# Patient Record
Sex: Male | Born: 1990 | Race: White | Hispanic: No | Marital: Single | Smoking: Current every day smoker
Health system: Southern US, Community
[De-identification: ages and names within clinical notes are randomized; demographics above are authoritative.]

## PROBLEM LIST (undated history)

## (undated) DIAGNOSIS — J45909 Unspecified asthma, uncomplicated: Secondary | ICD-10-CM

## (undated) DIAGNOSIS — I10 Essential (primary) hypertension: Secondary | ICD-10-CM

## (undated) HISTORY — PX: WISDOM TOOTH EXTRACTION: SHX21

---
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
## 2011-09-01 ENCOUNTER — Emergency Department: Payer: Self-pay | Admitting: Emergency Medicine

## 2012-10-24 IMAGING — CR DG SHOULDER 3+V*R*
1 series · 4 of 4 positions shown · non-contrast
Comparison: none

REASON FOR EXAM: pain
COMMENTS:   May transport without cardiac monitor

PROCEDURE:     DXR - DXR SHOULDER RIGHT COMPLETE  - September 01, 2011 [DATE]
RESULT:     No fracture, dislocation or other acute bony abnormality is
identified.

[Series 1: w shoulder external right · 0.14mm/px · 4 of 4 slices shown]
[im 1/4]
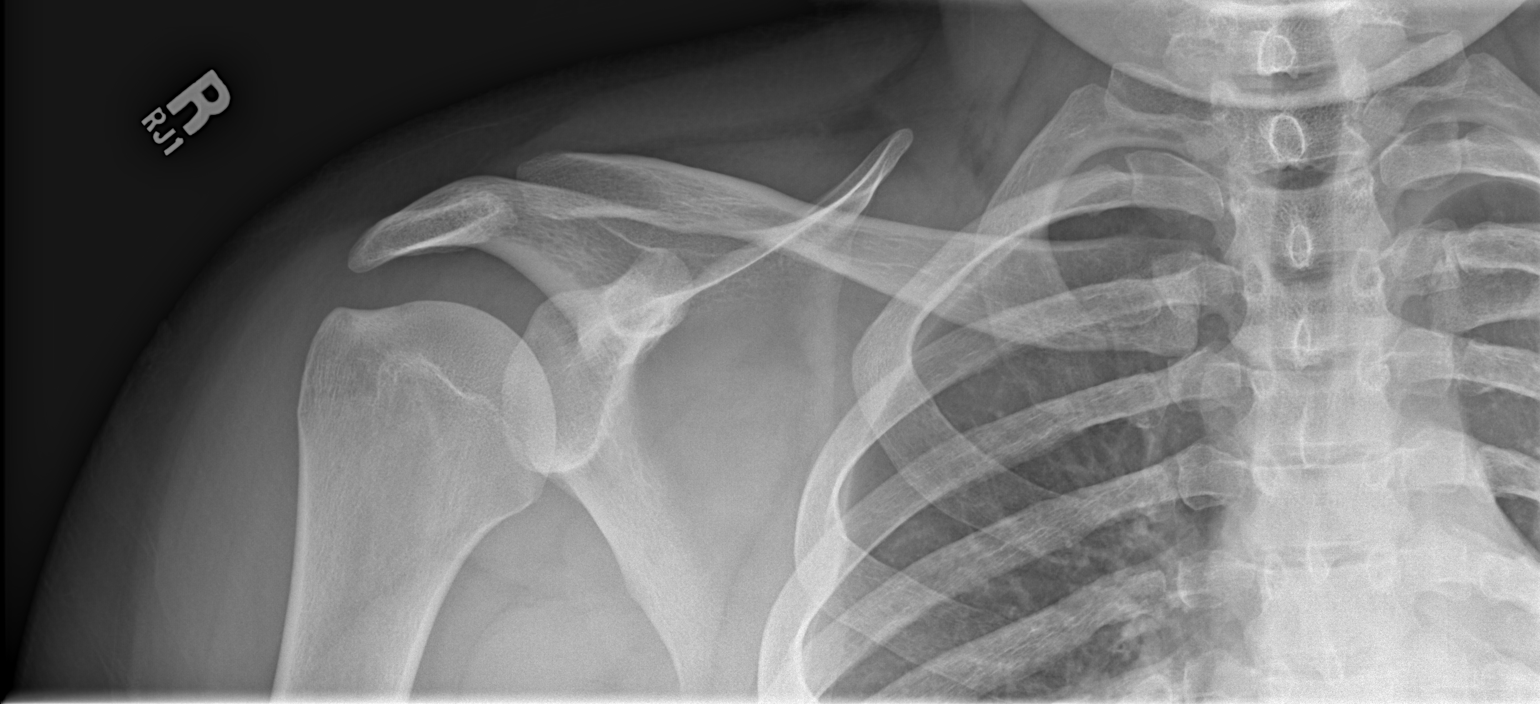
[im 2/4]
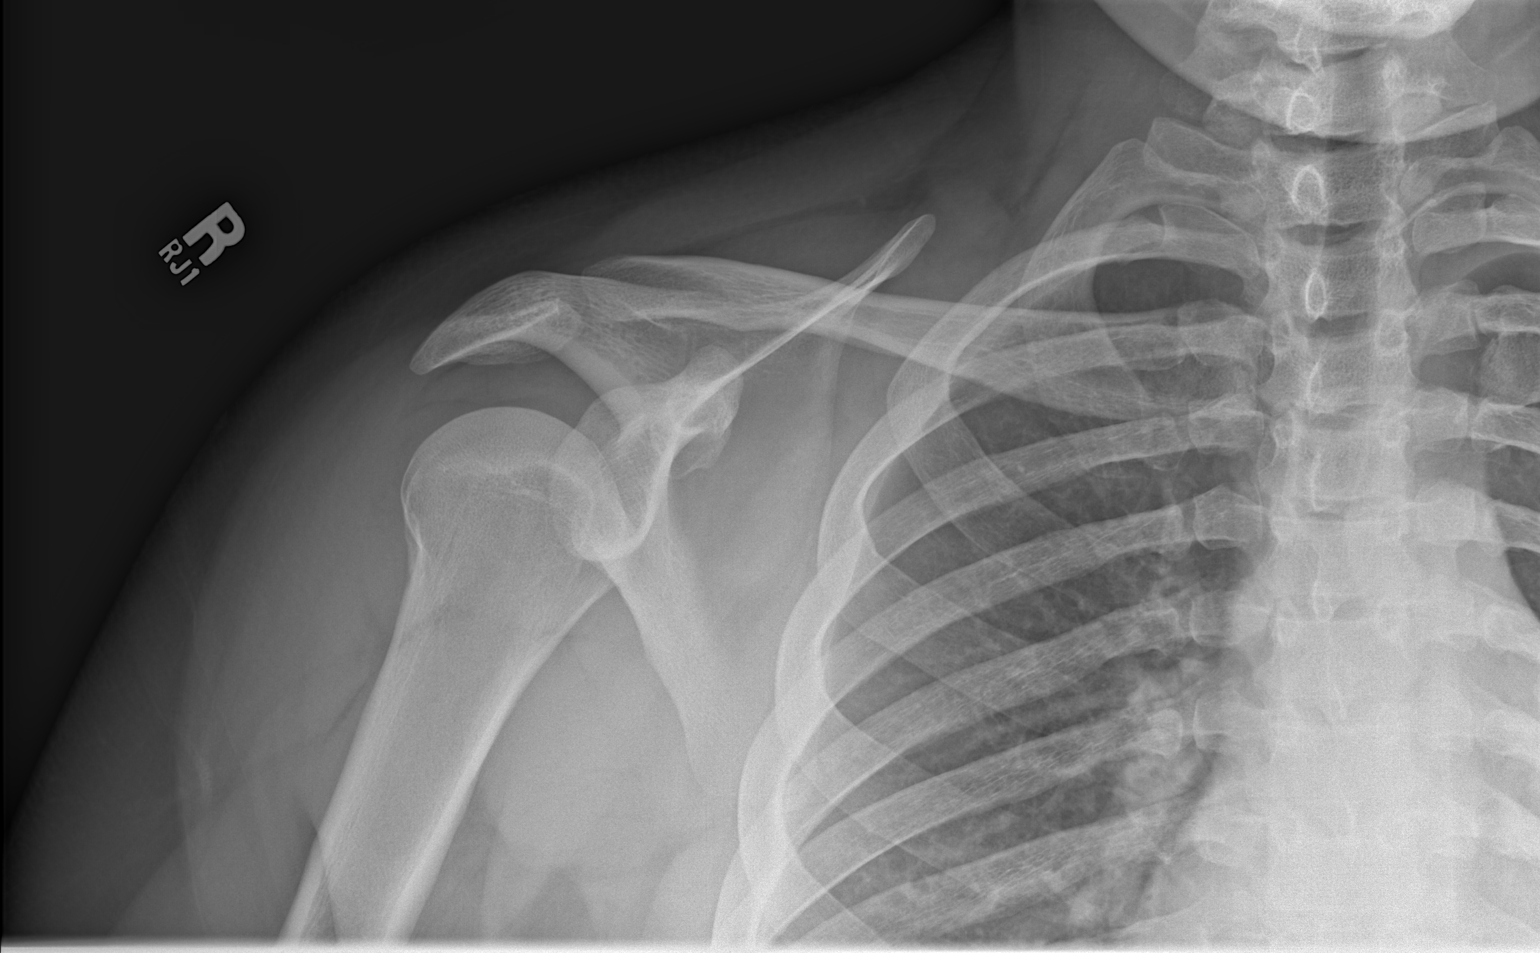
[im 3/4]
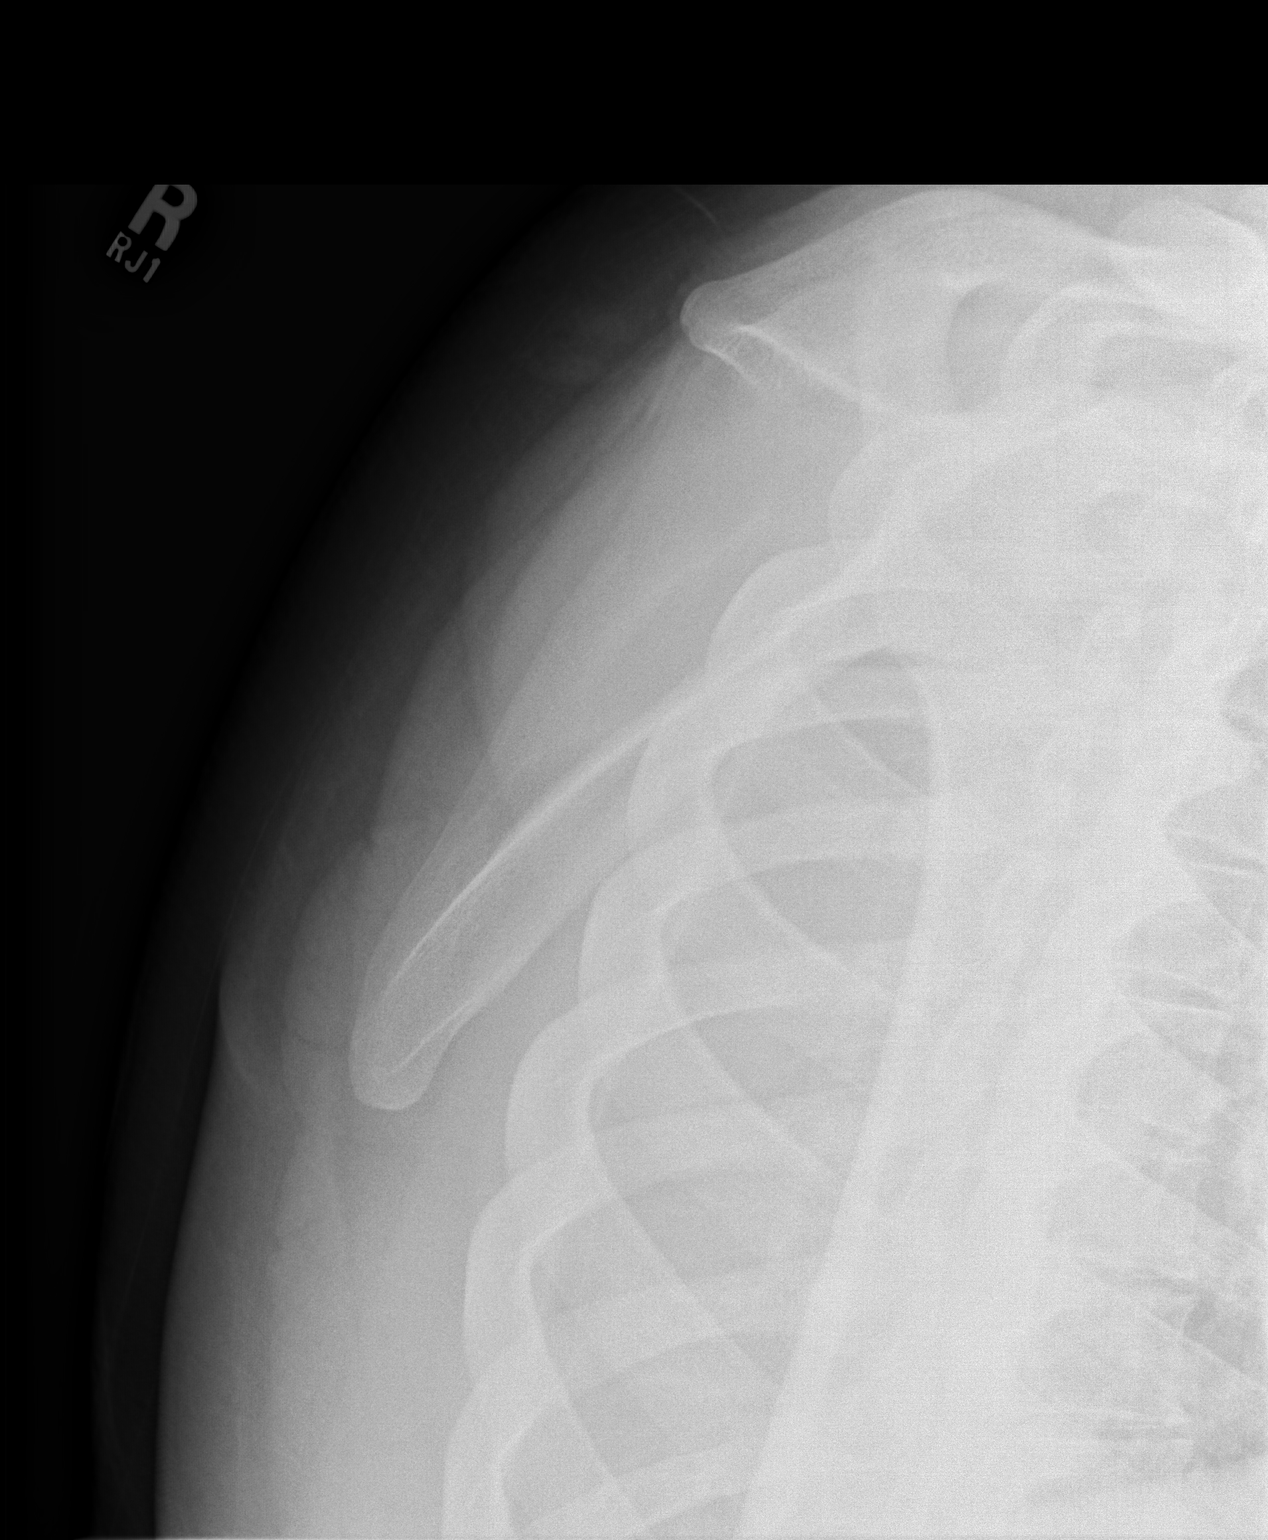
[im 4/4]
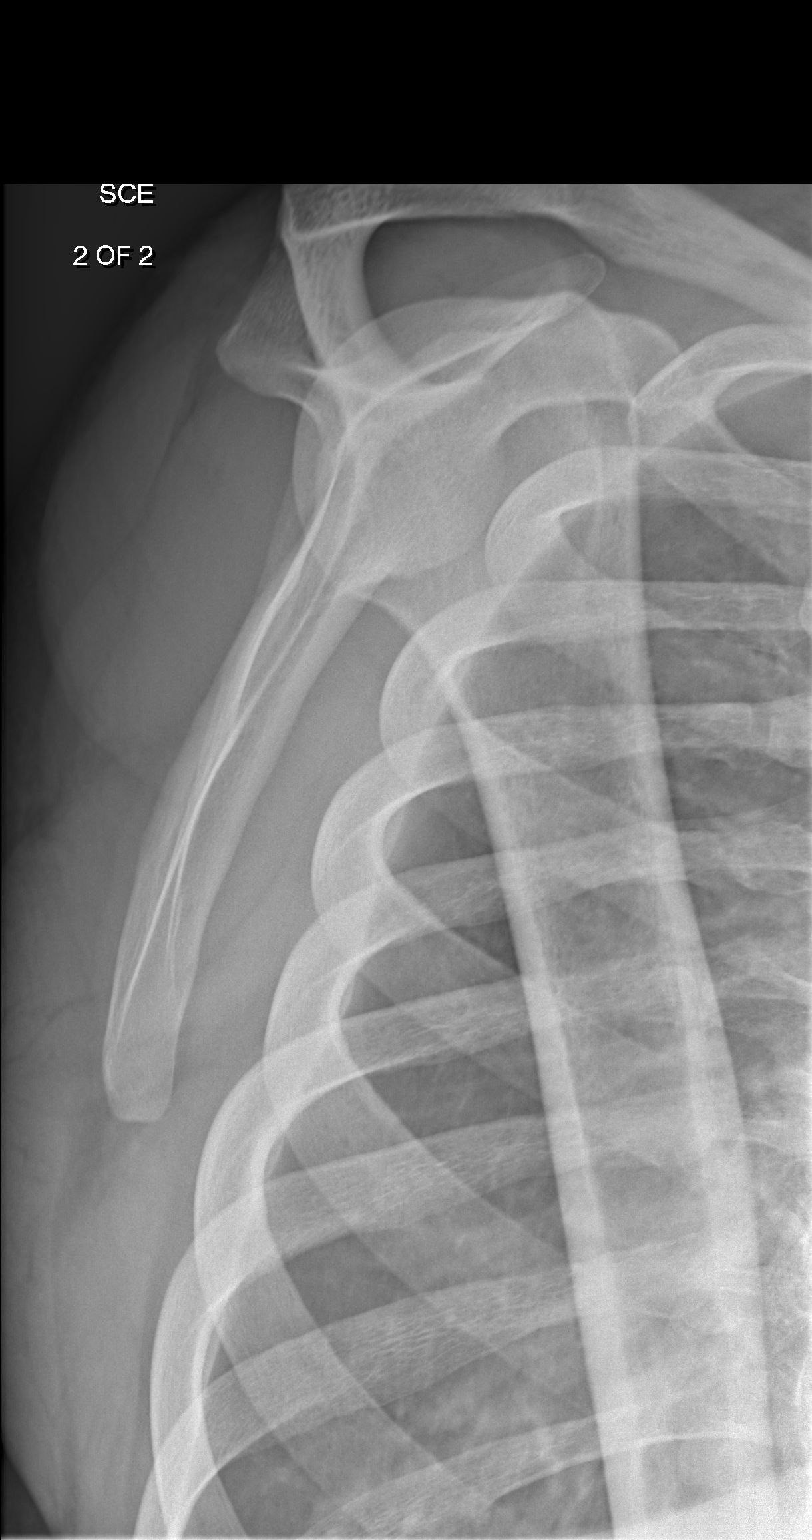

[4 of 4 positions shown; findings below may reference images not displayed]

IMPRESSION: No significant osseous abnormalities are noted.

[REDACTED]

## 2019-10-25 ENCOUNTER — Other Ambulatory Visit: Payer: Self-pay

## 2019-10-25 ENCOUNTER — Emergency Department
Admission: EM | Admit: 2019-10-25 | Discharge: 2019-10-25 | Disposition: A | Discharge status: Home / Self Care | Payer: Self-pay | Attending: Emergency Medicine | Admitting: Emergency Medicine

## 2019-10-25 DIAGNOSIS — J45909 Unspecified asthma, uncomplicated: Secondary | ICD-10-CM | POA: Insufficient documentation

## 2019-10-25 DIAGNOSIS — K649 Unspecified hemorrhoids: Secondary | ICD-10-CM | POA: Insufficient documentation

## 2019-10-25 DIAGNOSIS — F17200 Nicotine dependence, unspecified, uncomplicated: Secondary | ICD-10-CM | POA: Insufficient documentation

## 2019-10-25 HISTORY — DX: Unspecified asthma, uncomplicated: J45.909

## 2019-10-25 MED ORDER — GLYCERIN (ADULT) 2 G RE SUPP: 1.0000 | RECTAL | 0 refills | Status: AC | PRN

## 2019-10-25 MED ORDER — DIBUCAINE (PERIANAL) 1 % EX OINT: 1.0000 "application " | TOPICAL_OINTMENT | Freq: Three times a day (TID) | CUTANEOUS | 0 refills | Status: AC | PRN

## 2019-10-25 MED ORDER — GLYCERIN (LAXATIVE) 2.1 G RE SUPP
1.0000 | Freq: Once | RECTAL | Status: AC
  Administered 2019-10-25: 1 via RECTAL
  Filled 2019-10-25: qty 1

## 2019-10-25 MED ORDER — LIDOCAINE HCL URETHRAL/MUCOSAL 2 % EX GEL
1.0000 "application " | Freq: Once | CUTANEOUS | Status: AC
  Administered 2019-10-25: 1
  Filled 2019-10-25: qty 5

## 2019-10-25 MED ORDER — HYDROCODONE-ACETAMINOPHEN 5-325 MG PO TABS: 1.0000 | ORAL_TABLET | Freq: Four times a day (QID) | ORAL | 0 refills | Status: AC | PRN

## 2019-10-25 NOTE — ED Triage Notes (Signed)
Pt c/o of hemmorrhoids x 1 week. Denies bleeding. Hx of same. Taking stool softeners and ibuprofen. A&O, ambulatory.

## 2019-10-25 NOTE — ED Notes (Signed)
See triage note  Presents with rectal pain   Also noticed some bleeding hx of hemorrhoids

## 2019-10-25 NOTE — Discharge Instructions (Signed)
You are being treated for hemorrhoids. Use the suppositories and topical gel as directed. Follow-up with [facility] or return as needed.

## 2019-10-26 NOTE — ED Provider Notes (Signed)
[facility] Emergency Department Provider Note ____________________________________________  Time seen: 1633  I have reviewed the triage vital signs and the nursing notes.  HISTORY  Chief Complaint  Hemorrhoids  HPI Kenneth Abbott is a 29 y.o. male presents himself to the ED for evaluation of acute rectal pain.   Patient describes a firm area of swelling to the rectum that is tender to palpation.  He has been taking over-the-counter stool softeners and ibuprofen with limited benefit.  Patient describes attempting to push the tense skin back into the rectum, without success.  He denies any rectal bleeding, constipation, or fecal incontinence.  Past Medical History:  Diagnosis Date  . Asthma     There are no problems to display for this patient.   Past Surgical History:  Procedure Laterality Date  . WISDOM TOOTH EXTRACTION      Prior to Admission medications   Medication Sig Start Date End Date Taking? Authorizing Provider  dibucaine (NUPERCAINAL) 1 % OINT Place 1 application rectally 3 (three) times daily as needed for hemorrhoids. 10/25/19   Laquesha Holcomb, Charlesetta Ivory, PA-C  glycerin adult 2 g suppository Place 1 suppository rectally as needed for constipation. 10/25/19   Dewaine Morocho, Charlesetta Ivory, PA-C  HYDROcodone-acetaminophen (NORCO) 5-325 MG tablet Take 1 tablet by mouth every 6 (six) hours as needed. 10/25/19   Gaege Sangalang, Charlesetta Ivory, PA-C    Allergies Patient has no known allergies.  History reviewed. No pertinent family history.  Social History Social History   Tobacco Use  . Smoking status: Current Every Day Smoker  Substance Use Topics  . Alcohol use: Not Currently  . Drug use: Yes    Types: Marijuana    Review of Systems  Constitutional: Negative for fever. Cardiovascular: Negative for chest pain. Respiratory: Negative for shortness of breath. Gastrointestinal: Negative for abdominal pain, vomiting and diarrhea.  Rectal pain as  above.   Genitourinary: Negative for dysuria. Musculoskeletal: Negative for back pain. Skin: Negative for rash. Neurological: Negative for headaches, focal weakness or numbness. ____________________________________________  PHYSICAL EXAM:  VITAL SIGNS: ED Triage Vitals  Enc Vitals Group     BP 10/25/19 1241 (!) 160/106     Pulse Rate 10/25/19 1241 (!) 103     Resp 10/25/19 1241 18     Temp 10/25/19 1241 98.6 F (37 C)     Temp Source 10/25/19 1241 Oral     SpO2 10/25/19 1241 100 %     Weight 10/25/19 1240 255 lb (115.7 kg)     Height 10/25/19 1240 5\' 9"  (1.753 m)     Head Circumference --      Peak Flow --      Pain Score 10/25/19 1244 9     Pain Loc --      Pain Edu? --      Excl. in GC? --     Constitutional: Alert and oriented. Well appearing and in no distress. Head: Normocephalic and atraumatic. Eyes: Conjunctivae are normal. Normal extraocular movements Cardiovascular: Normal rate, regular rhythm. Normal distal pulses. Respiratory: Normal respiratory effort.  Gastrointestinal: Soft and nontender. No distention.  DRE and gross visual exam reveals a tense external hemorrhoid at the 9 o'clock position.  No incarceration or thrombosis is suspected. Musculoskeletal: Nontender with normal range of motion in all extremities.  Neurologic:  Normal gait without ataxia. Normal speech and language. No gross focal neurologic deficits are appreciated. Skin:  Skin is warm, dry and intact. No rash noted. Psychiatric: Mood and  affect are normal. Patient exhibits appropriate insight and judgment. ____________________________________________  PROCEDURES  Glycerin suppository 1 PR Lidocaine 2% jelly topical Procedures ____________________________________________  INITIAL IMPRESSION / ASSESSMENT AND PLAN / ED COURSE  Patient with ED evaluation of acute rectal pain and rectal swelling.  Patient clinical picture consistent with a nonthrombosed external hemorrhoid.  Patient is  discharged after suppository is inserted with prescriptions for the same as well as dibucaine, and hydrocodone.Marland Kitchen  He is encouraged to continue with a soft diet and stool softeners as needed.  He will follow-up with his primary provider or local community clinic.  Return to the ED as needed.  Kenneth Abbott was evaluated in Emergency Department on 10/26/2019 for the symptoms described in the history of present illness. He was evaluated in the context of the global COVID-19 pandemic, which necessitated consideration that the patient might be at risk for infection with the SARS-CoV-2 virus that causes COVID-19. Institutional protocols and algorithms that pertain to the evaluation of patients at risk for COVID-19 are in a state of rapid change based on information released by regulatory bodies including the CDC and federal and state organizations. These policies and algorithms were followed during the patient's care in the ED. ____________________________________________  FINAL CLINICAL IMPRESSION(S) / ED DIAGNOSES  Final diagnoses:  Hemorrhoids, unspecified hemorrhoid type      Lissa Hoard, PA-C 10/26/19 1533    Phineas Semen, MD 10/26/19 1725

## 2023-12-14 ENCOUNTER — Other Ambulatory Visit: Payer: Self-pay

## 2023-12-14 ENCOUNTER — Emergency Department
Admission: EM | Admit: 2023-12-14 | Discharge: 2023-12-14 | Disposition: A | Discharge status: Home / Self Care | Payer: Self-pay

## 2023-12-14 ENCOUNTER — Encounter: Payer: Self-pay | Admitting: Emergency Medicine

## 2023-12-14 DIAGNOSIS — R519 Headache, unspecified: Secondary | ICD-10-CM

## 2023-12-14 DIAGNOSIS — J45909 Unspecified asthma, uncomplicated: Secondary | ICD-10-CM | POA: Insufficient documentation

## 2023-12-14 DIAGNOSIS — I10 Essential (primary) hypertension: Secondary | ICD-10-CM | POA: Insufficient documentation

## 2023-12-14 LAB — RESP PANEL BY RT-PCR (RSV, FLU A&B, COVID)  RVPGX2
Influenza A by PCR: NEGATIVE
Influenza B by PCR: NEGATIVE
Resp Syncytial Virus by PCR: NEGATIVE
SARS Coronavirus 2 by RT PCR: NEGATIVE

## 2023-12-14 MED ORDER — AMLODIPINE BESYLATE 5 MG PO TABS: 5.0000 mg | ORAL_TABLET | Freq: Every day | ORAL | 2 refills | Status: AC

## 2023-12-14 NOTE — ED Notes (Signed)
 Pt states he has been having headaches with blurred vision for a few weeks now. Reports he was exposed to covid at work. Denies any s/s at time of assessment.

## 2023-12-14 NOTE — ED Triage Notes (Signed)
 Patient to ED via POV for intermittent headache, blurry vision, fatigue and nose bleeds. No symptoms at this time. Ongoing x3 weeks. Wears glasses- has not seen eye doctor.  No bleeding at this time. States a coworker recently had covid.

## 2023-12-14 NOTE — ED Provider Notes (Signed)
 [facility] Provider Note    Event Date/Time   First MD Initiated Contact with Patient 12/14/23 [phone]     (approximate)   History   Headache   HPI  Kenneth Abbott is a 33 y.o. male with PMH of asthma who presents for evaluation of headache, blurry vision and fatigue over the past few weeks. Patient states the headaches and blurry vision have been intermittent over the past few weeks.  Patient has some blurry vision specifically when he is driving.  Also occurs sometimes and he first wakes up.  Patient denies headache at this time.  He states he had 1 nosebleed last week.  Denies other associated symptoms like nausea, vomiting, dizziness, chest pain, shortness of breath, cough or congestion.  Does state that he has been under a lot of stress recently as he was on a jury for a murder trial and has had increased stress at work.      Physical Exam   Triage Vital Signs: ED Triage Vitals  Encounter Vitals Group     BP 12/14/23 0934 (!) 185/110     Girls Systolic BP Percentile --      Girls Diastolic BP Percentile --      Boys Systolic BP Percentile --      Boys Diastolic BP Percentile --      Pulse Rate 12/14/23 0934 (!) 104     Resp 12/14/23 0934 17     Temp 12/14/23 0934 98 F (36.7 C)     Temp Source 12/14/23 0934 Oral     SpO2 12/14/23 0934 97 %     Weight 12/14/23 0935 285 lb (129.3 kg)     Height 12/14/23 0935 5' 9 (1.753 m)     Head Circumference --      Peak Flow --      Pain Score 12/14/23 0935 0     Pain Loc --      Pain Education --      Exclude from Growth Chart --     Most recent vital signs: Vitals:   12/14/23 0934 12/14/23 1005  BP: (!) 185/110 (!) 151/99  Pulse: (!) 104 89  Resp: 17   Temp: 98 F (36.7 C)   SpO2: 97% 96%   General: Awake, no distress.  CV:  Good peripheral perfusion.  RRR. Resp:  Normal effort.  CTAB. Abd:  No distention.  Other:  PERRL, EOM intact, no focal neurodeficits, no ataxia, no pronator  drift.   ED Results / Procedures / Treatments   Labs (all labs ordered are listed, but only abnormal results are displayed) Labs Reviewed  RESP PANEL BY RT-PCR (RSV, FLU A&B, COVID)  [id number]     PROCEDURES:  Critical Care performed: No  Procedures   MEDICATIONS ORDERED IN ED: Medications - No data to display   IMPRESSION / MDM / ASSESSMENT AND PLAN / ED COURSE  I reviewed the triage vital signs and the nursing notes.                             33 year old male presents for evaluation of headache, blurry vision and fatigue.  Patient is hypertensive otherwise vital signs are stable.  Patient NAD on exam.  Differential diagnosis includes, but is not limited to, viral infection, asymptomatic hypertension, hypertensive emergency, migraines, tension headache.  Patient's presentation is most consistent with acute complicated illness / injury requiring diagnostic workup.  Respiratory panel  is negative for flu, COVID and RSV.  Patient is asymptomatic right now.  Denies headache, chest pain and shortness of breath.  Low suspicion for hypertensive emergency.  Suspect that patient's headaches and blurry vision may be related to stress versus migraines versus hypertension.  Did consider CT imaging however patient states that this is not the worst headache of his life I have a low suspicion for subarachnoid hemorrhage.  Feel that patient would benefit most from primary care follow-up for management of his blood pressure.  Discussed getting himself established.  I will start him on amlodipine while in the emergency department.  Also advised him to follow-up with his eye doctor as he may need a new prescription.  Advised taking Tylenol  and ibuprofen as needed for treatment of his headaches.  Patient voiced understanding, all questions were answered and he was stable at discharge.       FINAL CLINICAL IMPRESSION(S) / ED DIAGNOSES   Final diagnoses:  Asymptomatic hypertension   Nonintractable headache, unspecified chronicity pattern, unspecified headache type     Rx / DC Orders   ED Discharge Orders          Ordered    amLODipine (NORVASC) 5 MG tablet  Daily        12/14/23 1051    Ambulatory Referral to Primary Care (Establish Care)        12/14/23 1051             Note:  This document was prepared using Dragon voice recognition software and may include unintentional dictation errors.   Cleaster Tinnie LABOR, PA-C 12/14/23 1053    Clarine Ozell LABOR, MD 12/14/23 1549

## 2023-12-14 NOTE — Discharge Instructions (Addendum)
 You tested negative for flu, COVID and RSV today.  Your headaches may be to due to stress, migraines or high blood pressure.  I have sent some medication to the pharmacy for you to begin taking for treatment of your high blood pressure.  It is very important that you follow-up with your primary care provider.  I have placed a referral for you as well as attached information for once within the area.  Please call to schedule.  For treatment of your headaches, you can take Tylenol  or ibuprofen.  Please make sure you are drinking lots of water, eating regularly and getting good sleep.  Please schedule a follow up appointment with your eye doctor as you may need a new prescription.  Please return to the emergency department with any worsening symptoms.

## 2023-12-16 ENCOUNTER — Emergency Department: Payer: Self-pay

## 2023-12-16 ENCOUNTER — Other Ambulatory Visit: Payer: Self-pay

## 2023-12-16 ENCOUNTER — Emergency Department
Admission: EM | Admit: 2023-12-16 | Discharge: 2023-12-16 | Disposition: A | Discharge status: Home / Self Care | Payer: Self-pay | Attending: Emergency Medicine | Admitting: Emergency Medicine

## 2023-12-16 DIAGNOSIS — R519 Headache, unspecified: Secondary | ICD-10-CM | POA: Insufficient documentation

## 2023-12-16 DIAGNOSIS — H538 Other visual disturbances: Secondary | ICD-10-CM | POA: Insufficient documentation

## 2023-12-16 DIAGNOSIS — H4710 Unspecified papilledema: Secondary | ICD-10-CM

## 2023-12-16 HISTORY — DX: Essential (primary) hypertension: I10

## 2023-12-16 LAB — COMPREHENSIVE METABOLIC PANEL WITH GFR
ALT: 32 U/L (ref 0–44)
AST: 31 U/L (ref 15–41)
Albumin: 4.5 g/dL (ref 3.5–5.0)
Alkaline Phosphatase: 89 U/L (ref 38–126)
Anion gap: 14 (ref 5–15)
BUN: 7 mg/dL (ref 6–20)
CO2: 21 mmol/L — ABNORMAL LOW (ref 22–32)
Calcium: 9.4 mg/dL (ref 8.9–10.3)
Chloride: 101 mmol/L (ref 98–111)
Creatinine, Ser: 0.86 mg/dL (ref 0.61–1.24)
GFR, Estimated: >60 mL/min (ref >60)
Glucose, Bld: 158 mg/dL — ABNORMAL HIGH (ref 70–99)
Potassium: 3.8 mmol/L (ref 3.5–5.1)
Sodium: 136 mmol/L (ref 135–145)
Total Bilirubin: 0.9 mg/dL (ref 0.0–1.2)
Total Protein: 8.5 g/dL — ABNORMAL HIGH (ref 6.5–8.1)

## 2023-12-16 LAB — CBC WITH DIFFERENTIAL/PLATELET
Abs Immature Granulocytes: 0.03 K/uL (ref 0.00–0.07)
Basophils Absolute: 0 K/uL (ref 0.0–0.1)
Basophils Relative: 0 %
Eosinophils Absolute: 0.1 K/uL (ref 0.0–0.5)
Eosinophils Relative: 1 %
HCT: 51.3 % (ref 39.0–52.0)
Hemoglobin: 17.5 g/dL — ABNORMAL HIGH (ref 13.0–17.0)
Immature Granulocytes: 0 %
Lymphocytes Relative: 16 %
Lymphs Abs: 2.1 K/uL (ref 0.7–4.0)
MCH: 30.3 pg (ref 26.0–34.0)
MCHC: 34.1 g/dL (ref 30.0–36.0)
MCV: 88.8 fL (ref 80.0–100.0)
Monocytes Absolute: 0.7 K/uL (ref 0.1–1.0)
Monocytes Relative: 6 %
Neutro Abs: 9.6 K/uL — ABNORMAL HIGH (ref 1.7–7.7)
Neutrophils Relative %: 77 %
Platelets: 224 K/uL (ref 150–400)
RBC: 5.78 MIL/uL (ref 4.22–5.81)
RDW: 12.4 % (ref 11.5–15.5)
Smear Review: NORMAL
WBC: 12.6 K/uL — ABNORMAL HIGH (ref 4.0–10.5)
nRBC: 0 % (ref 0.0–0.2)

## 2023-12-16 MED ORDER — ACETAZOLAMIDE 250 MG PO TABS: 500.0000 mg | ORAL_TABLET | Freq: Two times a day (BID) | ORAL | 0 refills | Status: AC

## 2023-12-16 MED ORDER — GADOBUTROL 1 MMOL/ML IV SOLN
10.0000 mL | Freq: Once | INTRAVENOUS | Status: AC | PRN
  Administered 2023-12-16: 10 mL via INTRAVENOUS

## 2023-12-16 NOTE — ED Triage Notes (Signed)
 Pt to ED from eye doctor for bilateral blurred vision for a few weeks. Recently started HTN meds. +h/a

## 2023-12-16 NOTE — ED Notes (Signed)
Unsuccessful IV attempt x1. 

## 2023-12-16 NOTE — ED Notes (Signed)
 Ct not indicated at this time per Dr Dorothyann

## 2023-12-16 NOTE — ED Provider Notes (Signed)
 [facility] Provider Note    Event Date/Time   First MD Initiated Contact with Patient 12/16/23 1204     (approximate)   History   Blurred Vision   HPI  Kenneth Abbott is a 33 y.o. male who presents to the ED for evaluation of Blurred Vision   Obese patient presents to the ED with his mother for evaluation of headaches and blurry vision over the past 2-3 weeks.  Reports wearing glasses for years without any changes to his prescription and had normal visual acuity prior to about 3 weeks ago when he noticed blurry vision.   First thing he noticed that was abnormal was blurred visual fields, particularly on the right side.  Reports this caused him to develop a headache.  No falls, syncopal episodes.  No fevers or recent illnesses.   Physical Exam   Triage Vital Signs: ED Triage Vitals  Encounter Vitals Group     BP 12/16/23 1146 (!) 188/123     Girls Systolic BP Percentile --      Girls Diastolic BP Percentile --      Boys Systolic BP Percentile --      Boys Diastolic BP Percentile --      Pulse Rate 12/16/23 1144 (!) 117     Resp 12/16/23 1144 18     Temp 12/16/23 1145 97.8 F (36.6 C)     Temp src --      SpO2 12/16/23 1144 99 %     Weight 12/16/23 1144 284 lb 6.3 oz (129 kg)     Height 12/16/23 1144 5' 9 (1.753 m)     Head Circumference --      Peak Flow --      Pain Score 12/16/23 1144 0     Pain Loc --      Pain Education --      Exclude from Growth Chart --     Most recent vital signs: Vitals:   12/16/23 1224 12/16/23 1231  BP:  (!) 159/90  Pulse:    Resp:    Temp:    SpO2: 98%     General: Awake, no distress.  CV:  Good peripheral perfusion.  Resp:  Normal effort.  Abd:  No distention.  MSK:  No deformity noted.  Neuro:  No focal deficits appreciated.  Full strength and sensation to all 4 extremities. Unable to appreciate visual field cuts but he has some difficulty participating with this  assessment Other:     ED Results / Procedures / Treatments   Labs (all labs ordered are listed, but only abnormal results are displayed) Labs Reviewed  COMPREHENSIVE METABOLIC PANEL WITH GFR - Abnormal; Notable for the following components:      Result Value   CO2 21 (*)    Glucose, Bld 158 (*)    Total Protein 8.5 (*)    All other components within normal limits  CBC WITH DIFFERENTIAL/PLATELET - Abnormal; Notable for the following components:   WBC 12.6 (*)    Hemoglobin 17.5 (*)    Neutro Abs 9.6 (*)    All other components within normal limits    EKG   RADIOLOGY   Official radiology report(s): No results found.  PROCEDURES and INTERVENTIONS:  Procedures  Medications  gadobutrol (GADAVIST) 1 MMOL/ML injection 10 mL (10 mLs Intravenous Contrast Given 12/16/23 1502)     IMPRESSION / MDM / ASSESSMENT AND PLAN / ED COURSE  I reviewed the triage vital signs and  the nursing notes.  Differential diagnosis includes, but is not limited to, optic neuritis, stroke, IIH, symptomatic hyperglycemia or other metabolic derangement, acute stress reaction  {Patient presents with symptoms of an acute illness or injury that is potentially life-threatening.  Patient presents with subacute blurred vision and headaches.  Nonfocal exam without evidence of weakness or sensation changes to the extremities.  No significant metabolic derangements, mild leukocytosis but I doubt infectious etiology of his symptoms such as meningitis encephalitis, he has no meningeal features.    Doubt ICH.  MRI pending at the time of signout to oncoming physician.  Clinical Course as of 12/16/23 1511  Thu Dec 16, 2023  1457 I briefly discuss with neurology as patient is in MRI. Neuro has been exceptionally busy with abnormally high code stroke volume over the past few hours.  He agrees with MRIs that are ordered and request that the oncoming physician who I am about to sign out to touch base with him after  MRIs are read. [DS]  1459 Patient signed out to oncoming physician to follow-up on this MRI. [DS]    Clinical Course User Index [DS] Claudene Rover, MD     FINAL CLINICAL IMPRESSION(S) / ED DIAGNOSES   Final diagnoses:  Blurred vision     Rx / DC Orders   ED Discharge Orders     None        Note:  This document was prepared using Dragon voice recognition software and may include unintentional dictation errors.   Claudene Rover, MD 12/16/23 [phone]

## 2023-12-22 ENCOUNTER — Other Ambulatory Visit: Payer: Self-pay | Admitting: Neurology

## 2023-12-22 DIAGNOSIS — G932 Benign intracranial hypertension: Secondary | ICD-10-CM

## 2023-12-23 NOTE — Progress Notes (Signed)
 Patient for DG Lumbar Puncture on Friday 12/24/23, I spoke with the patient on the phone and gave pre-procedure instructions. Pt was made aware to be here at 8:30a and check in at the new entrance. Pt stated understanding. Called 12/22/23

## 2023-12-24 ENCOUNTER — Ambulatory Visit
Admission: RE | Admit: 2023-12-24 | Discharge: 2023-12-24 | Disposition: A | Discharge status: Home / Self Care | Payer: Self-pay | Source: Ambulatory Visit | Attending: Neurology | Admitting: Neurology

## 2023-12-24 DIAGNOSIS — G932 Benign intracranial hypertension: Secondary | ICD-10-CM | POA: Insufficient documentation

## 2023-12-24 MED ORDER — LIDOCAINE 1 % OPTIME INJ - NO CHARGE
5.0000 mL | Freq: Once | INTRAMUSCULAR | Status: DC
  Filled 2023-12-24: qty 6

## 2023-12-24 NOTE — Procedures (Signed)
 PROCEDURE SUMMARY:  Successful fluoroscopic guided lumbar puncture at the level of L4-5.  Opening pressure was 36 cm H2O ~20 mL clear colorless fluid collected and sent for labs.  Closing pressure was 8 cm H2O  No immediate complications.  Pt tolerated well.   EBL = none  Please see full dictation in imaging section of Epic for procedure details.   Electronically Signed: Shatha Hooser M Roniqua Kintz, PA-C 12/24/2023, 10:22 AM
